# Patient Record
Sex: Female | Born: 1945 | Race: White | Hispanic: No | Smoking: Never smoker
Health system: Southern US, Community
[De-identification: ages and names within clinical notes are randomized; demographics above are authoritative.]

## PROBLEM LIST (undated history)

## (undated) DIAGNOSIS — N200 Calculus of kidney: Secondary | ICD-10-CM

## (undated) DIAGNOSIS — M109 Gout, unspecified: Secondary | ICD-10-CM

## (undated) HISTORY — DX: Calculus of kidney: N20.0

## (undated) HISTORY — DX: Gout, unspecified: M10.9

---

## 2019-01-06 DIAGNOSIS — D469 Myelodysplastic syndrome, unspecified: Secondary | ICD-10-CM | POA: Diagnosis present

## 2019-09-01 DIAGNOSIS — K219 Gastro-esophageal reflux disease without esophagitis: Secondary | ICD-10-CM | POA: Diagnosis present

## 2019-09-01 DIAGNOSIS — N1831 Chronic kidney disease, stage 3a: Secondary | ICD-10-CM | POA: Diagnosis present

## 2019-09-15 DIAGNOSIS — M1A0710 Idiopathic chronic gout, right ankle and foot, without tophus (tophi): Secondary | ICD-10-CM | POA: Diagnosis present

## 2019-10-09 IMAGING — CT CT VIRTUAL COLONOSCOPY SCREENING
3 of 9 series · 15 of 46 positions shown, 17 images · non-contrast
Comparison: Colonoscopy report of 04/16/2018. This imaged only
through the level of the sigmoid.

CLINICAL DATA: Tortuous colon. Anemia with dark bloody stools.
Prior cholecystectomy and renal stone retrieval.

EXAM:
CT VIRTUAL COLONOSCOPY SCREENING
TECHNIQUE: The patient was given a standard bowel preparation with Gastrografin
and barium for fluid and stool tagging respectively. The quality of
the bowel preparation is excellent. Automated CO2 insufflation of
the colon was performed prior to image acquisition and colonic
distention is moderate. Image post processing was used to generate a
3D endoluminal fly-through projection of the colon and to
electronically subtract stool/fluid as appropriate.

[Series 3: supine colon 1.50 br40 s3 supine thins · axial · 0.72mm/px · z∈[+1250,+1361]mm · 3 of 336 slices shown]
[im 38/336  soft-tissue]
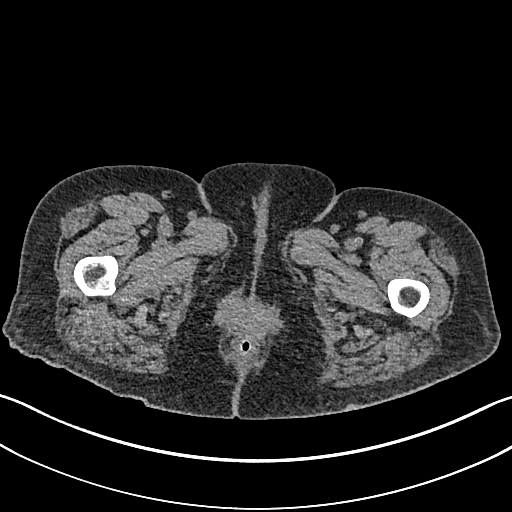
[im 75/336  soft-tissue]
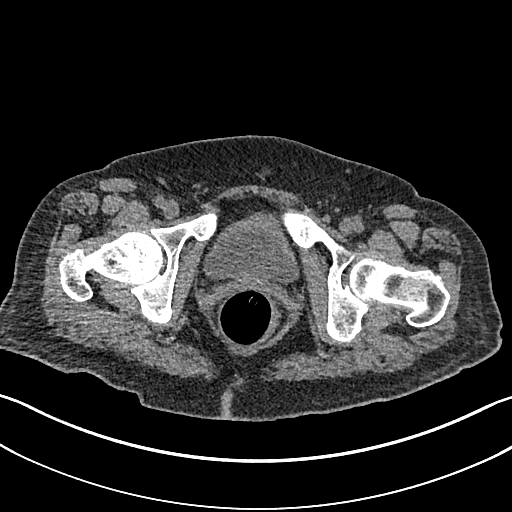
[im 112/336  soft-tissue]
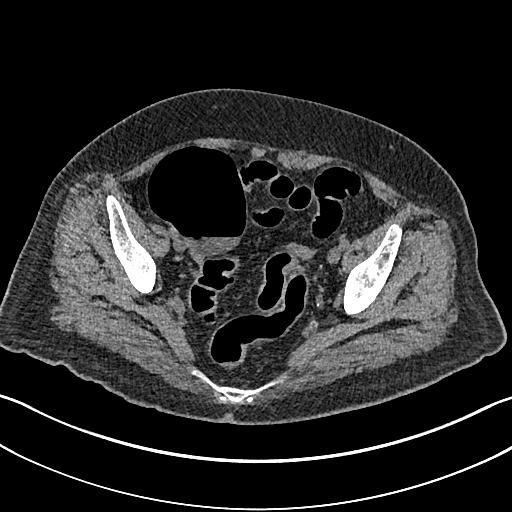

[Series 5: supine colon 3.00 br40 s3 cor cor supine · coronal · 0.72mm/px · 3 of 85 slices shown]
[im 22/85  soft-tissue]
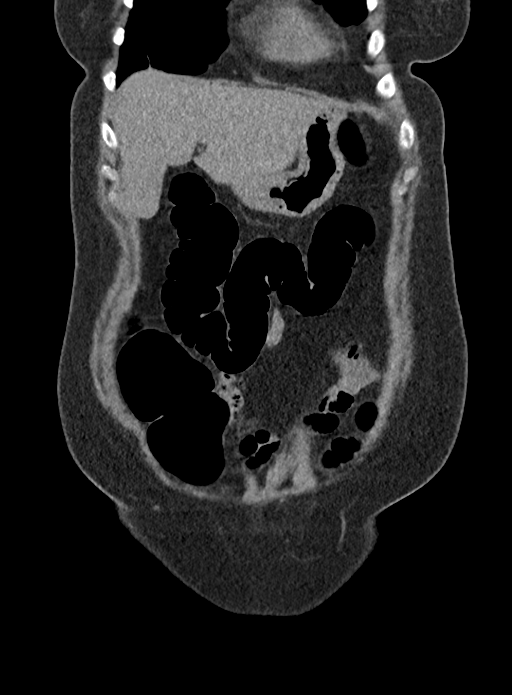
[im 43/85  soft-tissue]
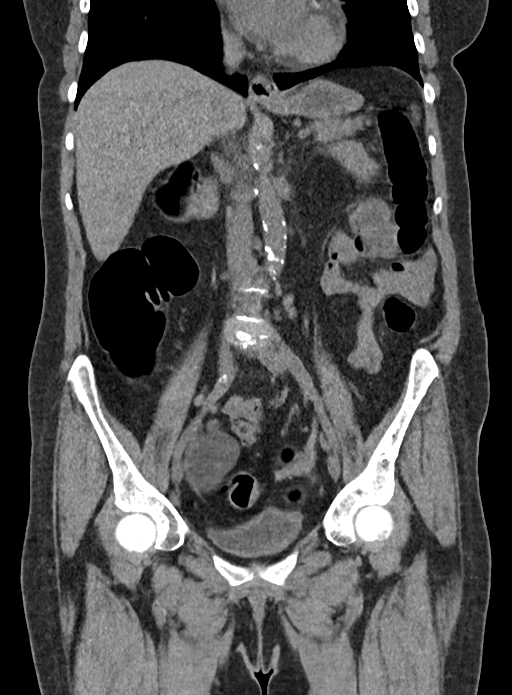
[im 64/85  soft-tissue]
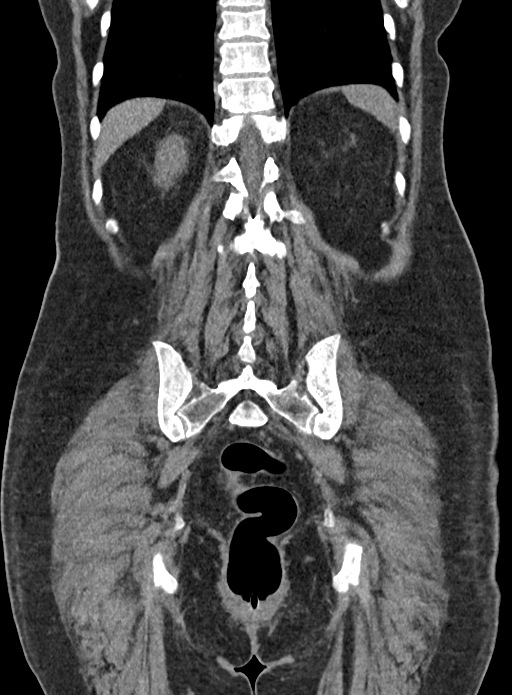

[Series 10: prone colon 1.50 br40 s3 prone thin · axial · 0.81mm/px · z∈[+1128,+1558]mm · 9 of 359 slices shown, 11 images]
[im 36/359  soft-tissue]
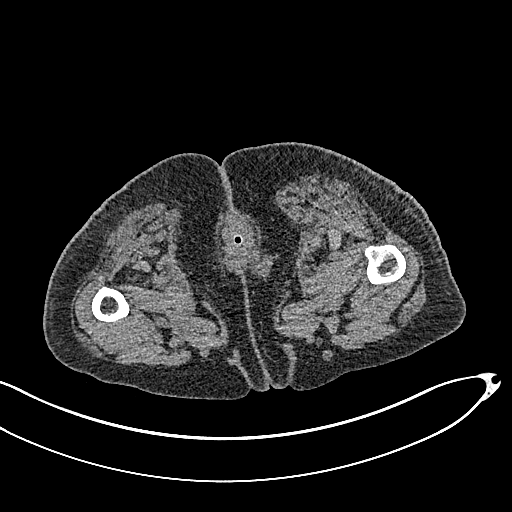
[im 36/359  bone]
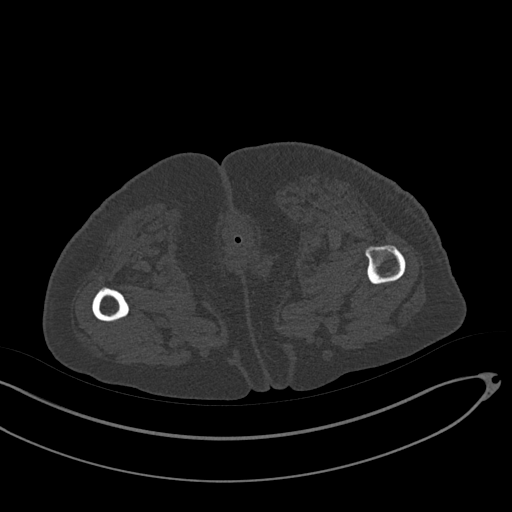
[im 72/359  soft-tissue]
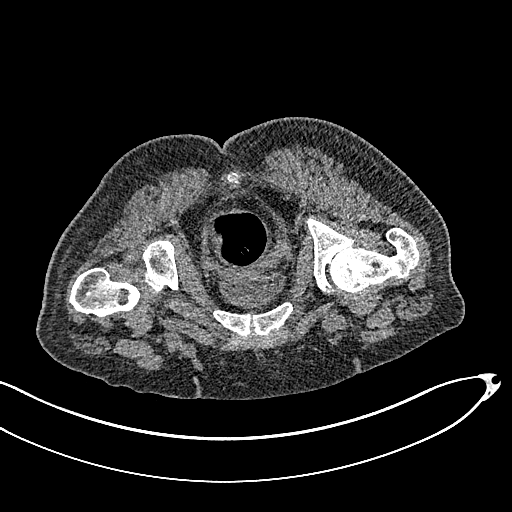
[im 108/359  soft-tissue]
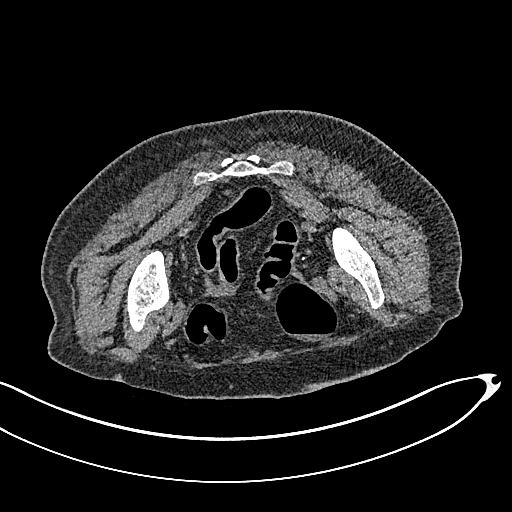
[im 144/359  soft-tissue]
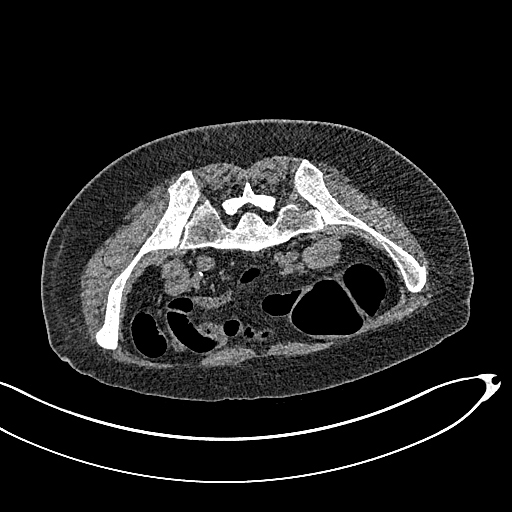
[im 180/359  soft-tissue]
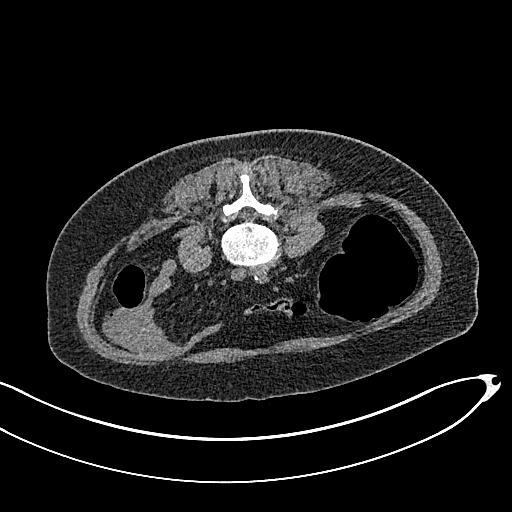
[im 215/359  soft-tissue]
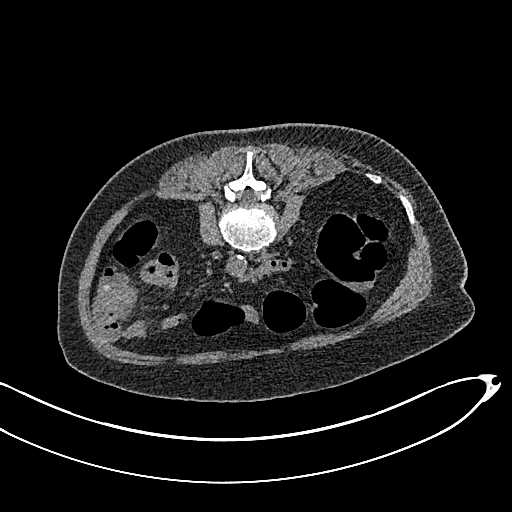
[im 251/359  soft-tissue]
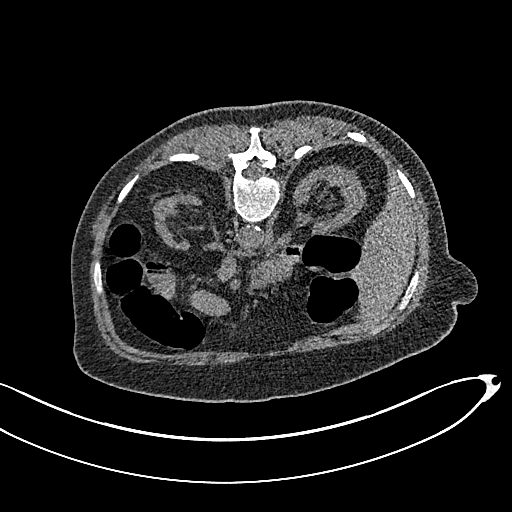
[im 287/359  soft-tissue]
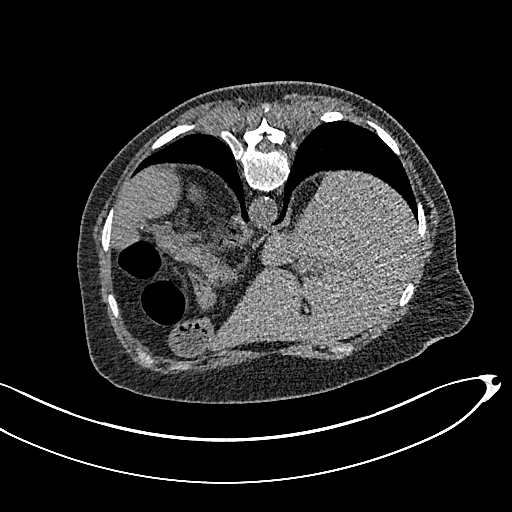
[im 323/359  soft-tissue]
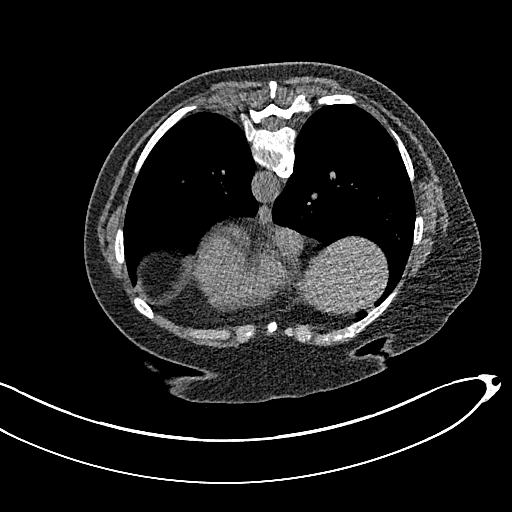
[im 323/359  bone]
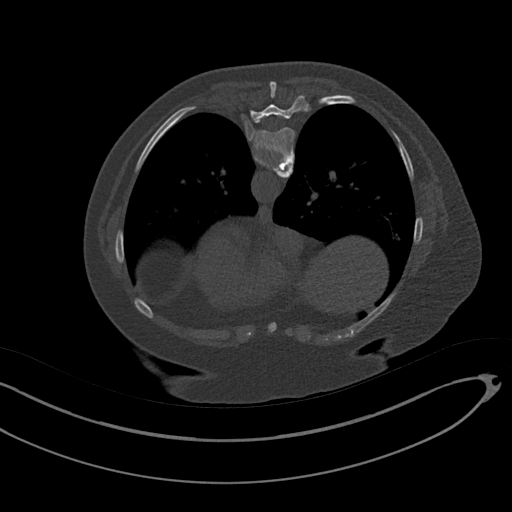

[15 of 46 positions shown; findings below may reference images not displayed]

FINDINGS: VIRTUAL COLONOSCOPY

On both supine and prone imaging, the sigmoid is suboptimally
distended. There are scattered diverticula in this area. Areas of
apparent colonic wall thickening within the sigmoid, including on
image 241/3 are most likely due to underdistention.

From the level of the distal descending colon proximally, there is
good distension without evidence of clinically significant colonic
polyp or mass.

Virtual colonoscopy is not designed to detect diminutive polyps
(i.e., less than or equal to 5 mm), the presence or absence of which
may not affect clinical management.

CT ABDOMEN AND PELVIS WITHOUT CONTRAST

Lower chest: Bibasilar subsegmental atelectasis. Normal heart size
without pericardial or pleural effusion. Multivessel coronary artery
atherosclerosis.

Hepatobiliary: Normal liver. Cholecystectomy, without biliary ductal
dilatation.

Pancreas: Normal, without mass or ductal dilatation.

Spleen: Normal in size, without focal abnormality.

Adrenals/Urinary Tract: Normal adrenal glands. Moderate bilateral
renal cortical thinning, worse on the left. 5 mm lower pole left
renal collecting system calculus. No bladder calculi.

Stomach/Bowel: Normal stomach, without wall thickening. Normal small
bowel.

Vascular/Lymphatic: Aortic atherosclerosis. No abdominopelvic
adenopathy.

Reproductive: Hysterectomy.  No adnexal mass.

Other: No significant free fluid.  No free intraperitoneal air.

Musculoskeletal: No acute osseous abnormality. Degenerate disc
disease at L4-5 and L5-S1.
IMPRESSION: 1. Suboptimal distension of the sigmoid, without evidence of
circumferential mass. Apparent areas of mild wall thickening are
likely due to underdistention. On the prior this segment was
reportedly well evaluated on colonoscopy.
2. More proximally, no evidence of clinically significant colonic
polyp or mass.
3. Left nephrolithiasis.
4.  Aortic Atherosclerosis ([id number]).

## 2019-12-05 ENCOUNTER — Inpatient Hospital Stay: Payer: No Typology Code available for payment source

## 2019-12-05 ENCOUNTER — Inpatient Hospital Stay (HOSPITAL_COMMUNITY): Payer: No Typology Code available for payment source

## 2019-12-05 ENCOUNTER — Emergency Department: Payer: No Typology Code available for payment source

## 2019-12-05 ENCOUNTER — Inpatient Hospital Stay
Admission: EM | Admit: 2019-12-05 | Discharge: 2019-12-06 | DRG: 176 | Disposition: A | Discharge status: Home / Self Care | Payer: No Typology Code available for payment source | Attending: Internal Medicine | Admitting: Internal Medicine

## 2019-12-05 DIAGNOSIS — K219 Gastro-esophageal reflux disease without esophagitis: Secondary | ICD-10-CM | POA: Diagnosis present

## 2019-12-05 DIAGNOSIS — I2699 Other pulmonary embolism without acute cor pulmonale: Secondary | ICD-10-CM

## 2019-12-05 DIAGNOSIS — M1A9XX0 Chronic gout, unspecified, without tophus (tophi): Secondary | ICD-10-CM | POA: Diagnosis present

## 2019-12-05 DIAGNOSIS — Z9049 Acquired absence of other specified parts of digestive tract: Secondary | ICD-10-CM

## 2019-12-05 DIAGNOSIS — Z8616 Personal history of COVID-19: Secondary | ICD-10-CM

## 2019-12-05 DIAGNOSIS — M1A0710 Idiopathic chronic gout, right ankle and foot, without tophus (tophi): Secondary | ICD-10-CM | POA: Diagnosis present

## 2019-12-05 DIAGNOSIS — R079 Chest pain, unspecified: Secondary | ICD-10-CM

## 2019-12-05 DIAGNOSIS — I82409 Acute embolism and thrombosis of unspecified deep veins of unspecified lower extremity: Secondary | ICD-10-CM | POA: Diagnosis present

## 2019-12-05 DIAGNOSIS — D630 Anemia in neoplastic disease: Secondary | ICD-10-CM | POA: Diagnosis present

## 2019-12-05 DIAGNOSIS — Z880 Allergy status to penicillin: Secondary | ICD-10-CM

## 2019-12-05 DIAGNOSIS — D631 Anemia in chronic kidney disease: Secondary | ICD-10-CM | POA: Diagnosis present

## 2019-12-05 DIAGNOSIS — D469 Myelodysplastic syndrome, unspecified: Secondary | ICD-10-CM | POA: Diagnosis present

## 2019-12-05 DIAGNOSIS — I82431 Acute embolism and thrombosis of right popliteal vein: Secondary | ICD-10-CM | POA: Diagnosis present

## 2019-12-05 DIAGNOSIS — Z809 Family history of malignant neoplasm, unspecified: Secondary | ICD-10-CM

## 2019-12-05 DIAGNOSIS — Z90710 Acquired absence of both cervix and uterus: Secondary | ICD-10-CM

## 2019-12-05 DIAGNOSIS — Z66 Do not resuscitate: Secondary | ICD-10-CM | POA: Diagnosis present

## 2019-12-05 DIAGNOSIS — Z79899 Other long term (current) drug therapy: Secondary | ICD-10-CM

## 2019-12-05 DIAGNOSIS — D649 Anemia, unspecified: Secondary | ICD-10-CM | POA: Diagnosis present

## 2019-12-05 DIAGNOSIS — Z87442 Personal history of urinary calculi: Secondary | ICD-10-CM

## 2019-12-05 DIAGNOSIS — N1831 Chronic kidney disease, stage 3a: Secondary | ICD-10-CM | POA: Diagnosis present

## 2019-12-05 LAB — ECG 12-LEAD
P Wave Axis: 43 deg
P-R Interval: 132 ms
Patient Age: 73 years
Q-T Interval(Corrected): 448 ms
Q-T Interval: 362 ms
QRS Axis: 22 deg
QRS Duration: 87 ms
T Axis: 28 years
Ventricular Rate: 92 //min

## 2019-12-05 LAB — CBC AND DIFFERENTIAL
Basophils %: 1.8 % (ref 0.0–3.0)
Basophils Absolute: 0.1 10*3/uL (ref 0.0–0.3)
Eosinophils %: 5.4 % (ref 0.0–7.0)
Eosinophils Absolute: 0.3 10*3/uL (ref 0.0–0.8)
Hematocrit: 33.1 % — ABNORMAL LOW (ref 36.0–48.0)
Hemoglobin: 9.9 gm/dL — ABNORMAL LOW (ref 12.0–16.0)
Lymphocytes Absolute: 1.5 10*3/uL (ref 0.6–5.1)
Lymphocytes: 23 % (ref 15.0–46.0)
MCH: 31 pg (ref 28–35)
MCHC: 30 gm/dL — ABNORMAL LOW (ref 32–36)
MCV: 103 fL — ABNORMAL HIGH (ref 80–100)
MPV: 10.5 fL — ABNORMAL HIGH (ref 6.0–10.0)
Monocytes Absolute: 0.7 10*3/uL (ref 0.1–1.7)
Monocytes: 11.4 % (ref 3.0–15.0)
Neutrophils %: 58.4 % (ref 42.0–78.0)
Neutrophils Absolute: 3.8 10*3/uL (ref 1.7–8.6)
PLT CT: 188 10*3/uL (ref 130–440)
RBC: 3.23 10*6/uL — ABNORMAL LOW (ref 3.80–5.00)
RDW: 20 % — ABNORMAL HIGH (ref 11.0–14.0)
WBC: 6.4 10*3/uL (ref 4.0–11.0)

## 2019-12-05 LAB — VH URINALYSIS WITH MICROSCOPIC IF INDICATED
Bilirubin, UA: NEGATIVE
Blood, UA: NEGATIVE
Glucose, UA: NEGATIVE mg/dL
Ketones UA: NEGATIVE mg/dL
Leukocyte Esterase, UA: 75 Leu/uL — AB
Nitrite, UA: NEGATIVE
Protein, UR: NEGATIVE mg/dL
RBC, UA: 3 /hpf (ref 0–5)
Squam Epithel, UA: 52 /lpf — ABNORMAL HIGH (ref 0–2)
Urine Specific Gravity: 1.017
Urobilinogen, UA: NORMAL mg/dL
WBC, UA: 6 /hpf — ABNORMAL HIGH (ref 0–4)
pH, Urine: 5.5 pH (ref 5.0–8.0)

## 2019-12-05 LAB — COMPREHENSIVE METABOLIC PANEL
ALT: <6 U/L (ref 0–55)
AST (SGOT): 10 U/L (ref 10–42)
Albumin/Globulin Ratio: 0.97 Ratio (ref 0.80–2.00)
Albumin: 3 gm/dL — ABNORMAL LOW (ref 3.5–5.0)
Alkaline Phosphatase: 74 U/L (ref 40–145)
Anion Gap: 12.4 mMol/L (ref 7.0–18.0)
BUN / Creatinine Ratio: 12.5 Ratio (ref 10.0–30.0)
BUN: 14 mg/dL (ref 7–22)
Bilirubin, Total: 0.3 mg/dL (ref 0.1–1.2)
CO2: 27 mMol/L (ref 20.0–30.0)
Calcium: 8.7 mg/dL (ref 8.5–10.5)
Chloride: 104 mMol/L (ref 98–110)
Creatinine: 1.12 mg/dL (ref 0.60–1.20)
EGFR: 48 mL/min/{1.73_m2} — ABNORMAL LOW (ref 60–150)
Globulin: 3.1 gm/dL (ref 2.0–4.0)
Glucose: 113 mg/dL — ABNORMAL HIGH (ref 71–99)
Osmolality Calculated: 281 mOsm/kg (ref 275–300)
Potassium: 3.4 mMol/L — ABNORMAL LOW (ref 3.5–5.3)
Protein, Total: 6.1 gm/dL (ref 6.0–8.3)
Sodium: 140 mMol/L (ref 136–147)

## 2019-12-05 LAB — HEMOGLOBIN AND HEMATOCRIT, BLOOD
Hematocrit: 31.3 % — ABNORMAL LOW (ref 36.0–48.0)
Hemoglobin: 9.5 gm/dL — ABNORMAL LOW (ref 12.0–16.0)

## 2019-12-05 LAB — D-DIMER, QUANTITATIVE: D-Dimer: 2.54 mg/L FEU — ABNORMAL HIGH (ref 0.19–0.52)

## 2019-12-05 LAB — TROPONIN I: Troponin I: 0 ng/mL (ref 0.00–0.02)

## 2019-12-05 LAB — PT/INR
PT INR: 1 (ref 0.9–1.2)
PT: 10.5 s (ref 9.4–11.5)

## 2019-12-05 LAB — APTT: aPTT: 24 s (ref 22.0–33.0)

## 2019-12-05 LAB — B-TYPE NATRIURETIC PEPTIDE: B-Natriuretic Peptide: 36.7 pg/mL (ref 0.0–100.0)

## 2019-12-05 MED ORDER — VH HEPARIN SODIUM (PORCINE) 10000 UNIT/ML IJ SOLN (INITIAL BOLUS)
5000.00 [IU] | Freq: Once | INTRAMUSCULAR | Status: DC
Start: 2019-12-05 — End: 2019-12-05

## 2019-12-05 MED ORDER — VH HEPARIN SODIUM (PORCINE) 10000 UNIT/ML IJ SOLN (REBOLUS)
2000.00 [IU] | Freq: Four times a day (QID) | INTRAMUSCULAR | Status: DC | PRN
Start: 2019-12-05 — End: 2019-12-05

## 2019-12-05 MED ORDER — BISACODYL 5 MG PO TBEC
5.00 mg | DELAYED_RELEASE_TABLET | Freq: Every day | ORAL | Status: DC
Start: 2019-12-07 — End: 2019-12-06

## 2019-12-05 MED ORDER — NALOXONE HCL 0.4 MG/ML IJ SOLN (WRAP)
0.40 mg | INTRAMUSCULAR | Status: DC | PRN
Start: 2019-12-05 — End: 2019-12-06

## 2019-12-05 MED ORDER — ACETAMINOPHEN 160 MG/5ML PO SOLN
650.00 mg | ORAL | Status: DC | PRN
Start: 2019-12-05 — End: 2019-12-06

## 2019-12-05 MED ORDER — BISACODYL 10 MG RE SUPP
10.00 mg | Freq: Every day | RECTAL | Status: DC
Start: 2019-12-07 — End: 2019-12-06

## 2019-12-05 MED ORDER — PANTOPRAZOLE SODIUM 40 MG PO TBEC
40.00 mg | DELAYED_RELEASE_TABLET | Freq: Two times a day (BID) | ORAL | Status: DC
Start: 2019-12-05 — End: 2019-12-06
  Administered 2019-12-05: 16:00:00 40 mg via ORAL
  Filled 2019-12-05 (×2): qty 1

## 2019-12-05 MED ORDER — VH POTASSIUM CHLORIDE CRYS ER 20 MEQ PO TBCR (WRAP)
40.00 meq | EXTENDED_RELEASE_TABLET | Freq: Once | ORAL | Status: DC
Start: 2019-12-05 — End: 2019-12-05

## 2019-12-05 MED ORDER — ALLOPURINOL 100 MG PO TABS
100.0000 mg | ORAL_TABLET | Freq: Every day | ORAL | Status: DC
Start: 2019-12-05 — End: 2019-12-06
  Administered 2019-12-05: 16:00:00 100 mg via ORAL
  Filled 2019-12-05 (×2): qty 1

## 2019-12-05 MED ORDER — OXYCODONE-ACETAMINOPHEN 5-325 MG PO TABS
1.0000 | ORAL_TABLET | ORAL | Status: DC | PRN
Start: 2019-12-05 — End: 2019-12-06
  Administered 2019-12-05 – 2019-12-06 (×2): 2 via ORAL
  Filled 2019-12-05 (×2): qty 2

## 2019-12-05 MED ORDER — ACETAMINOPHEN 325 MG PO TABS
650.0000 mg | ORAL_TABLET | ORAL | Status: DC | PRN
Start: 2019-12-05 — End: 2019-12-06
  Administered 2019-12-05 – 2019-12-06 (×2): 650 mg via ORAL
  Filled 2019-12-05 (×2): qty 2

## 2019-12-05 MED ORDER — VH HEPARIN (PORCINE) IN D5W 50-5 UNIT/ML-% IV SOLN
0.00 [IU]/h | INTRAVENOUS | Status: DC
Start: 2019-12-05 — End: 2019-12-05

## 2019-12-05 MED ORDER — ENOXAPARIN SODIUM 60 MG/0.6ML SC SOLN
1.00 mg/kg | Freq: Two times a day (BID) | SUBCUTANEOUS | Status: DC
Start: 2019-12-05 — End: 2019-12-06
  Administered 2019-12-05 – 2019-12-06 (×3): 60 mg via SUBCUTANEOUS
  Filled 2019-12-05 (×2): qty 0.6

## 2019-12-05 MED ORDER — PROCHLORPERAZINE EDISYLATE 10 MG/2ML IJ SOLN
5.00 mg | INTRAMUSCULAR | Status: DC | PRN
Start: 2019-12-05 — End: 2019-12-06
  Administered 2019-12-06: 03:00:00 5 mg via INTRAVENOUS
  Filled 2019-12-05: qty 2

## 2019-12-05 MED ORDER — POLYETHYLENE GLYCOL 3350 17 G PO PACK
17.00 g | PACK | Freq: Two times a day (BID) | ORAL | Status: DC
Start: 2019-12-05 — End: 2019-12-06
  Filled 2019-12-05 (×2): qty 1

## 2019-12-05 MED ORDER — SODIUM CHLORIDE (PF) 0.9 % IJ SOLN
3.00 mL | Freq: Three times a day (TID) | INTRAMUSCULAR | Status: DC
Start: 2019-12-05 — End: 2019-12-06
  Administered 2019-12-05 – 2019-12-06 (×3): 3 mL via INTRAVENOUS
  Filled 2019-12-05: qty 10

## 2019-12-05 MED ORDER — ONDANSETRON 4 MG PO TBDP
4.00 mg | ORAL_TABLET | Freq: Three times a day (TID) | ORAL | Status: DC | PRN
Start: 2019-12-05 — End: 2019-12-06

## 2019-12-05 MED ORDER — ONDANSETRON HCL 4 MG/2ML IJ SOLN
4.00 mg | Freq: Three times a day (TID) | INTRAMUSCULAR | Status: DC | PRN
Start: 2019-12-05 — End: 2019-12-06
  Administered 2019-12-06: 10:00:00 4 mg via INTRAVENOUS
  Filled 2019-12-05: qty 2

## 2019-12-05 MED ORDER — ACETAMINOPHEN 10 MG/ML IV SOLN
INTRAVENOUS | Status: AC
Start: 2019-12-05 — End: ?
  Filled 2019-12-05: qty 100

## 2019-12-05 MED ORDER — ACETAMINOPHEN 10 MG/ML IV SOLN
1000.00 mg | Freq: Once | INTRAVENOUS | Status: AC
Start: 2019-12-05 — End: 2019-12-05
  Administered 2019-12-05: 09:00:00 1000 mg via INTRAVENOUS

## 2019-12-05 MED ORDER — FERROUS SULFATE 325 (65 FE) MG PO TABS
325.0000 mg | ORAL_TABLET | Freq: Every morning | ORAL | Status: DC
Start: 2019-12-05 — End: 2019-12-06
  Administered 2019-12-05: 16:00:00 325 mg via ORAL
  Filled 2019-12-05 (×2): qty 1

## 2019-12-05 MED ORDER — MORPHINE SULFATE 4 MG/ML IJ/IV SOLN (WRAP)
1.0000 mg | Status: DC | PRN
Start: 2019-12-05 — End: 2019-12-06
  Filled 2019-12-05: qty 1

## 2019-12-05 MED ORDER — POTASSIUM CHLORIDE 20 MEQ/15ML (10%) PO SOLN
ORAL | Status: AC
Start: 2019-12-05 — End: ?
  Filled 2019-12-05: qty 30

## 2019-12-05 MED ORDER — MELATONIN 3 MG PO TABS
3.0000 mg | ORAL_TABLET | Freq: Every evening | ORAL | Status: DC | PRN
Start: 2019-12-05 — End: 2019-12-06

## 2019-12-05 MED ORDER — VH POTASSIUM CHLORIDE CRYS ER 20 MEQ PO TBCR (WRAP)
EXTENDED_RELEASE_TABLET | ORAL | Status: AC
Start: 2019-12-05 — End: ?
  Filled 2019-12-05: qty 2

## 2019-12-05 MED ORDER — IOHEXOL 350 MG/ML IV SOLN
70.00 mL | Freq: Once | INTRAVENOUS | Status: AC | PRN
Start: 2019-12-05 — End: 2019-12-05
  Administered 2019-12-05: 11:00:00 70 mL via INTRAVENOUS

## 2019-12-05 MED ORDER — ACETAMINOPHEN 650 MG RE SUPP
650.00 mg | RECTAL | Status: DC | PRN
Start: 2019-12-05 — End: 2019-12-06

## 2019-12-05 MED ORDER — POTASSIUM CHLORIDE 20 MEQ/15ML (10%) PO SOLN
40.00 meq | Freq: Once | ORAL | Status: AC
Start: 2019-12-05 — End: 2019-12-05
  Administered 2019-12-05: 13:00:00 40 meq via ORAL

## 2019-12-05 MED ORDER — ENOXAPARIN SODIUM 60 MG/0.6ML SC SOLN
1.00 mg/kg | Freq: Two times a day (BID) | SUBCUTANEOUS | Status: DC
Start: 2019-12-05 — End: 2019-12-05

## 2019-12-05 MED ORDER — ENOXAPARIN SODIUM 60 MG/0.6ML SC SOLN
SUBCUTANEOUS | Status: AC
Start: 2019-12-05 — End: ?
  Filled 2019-12-05: qty 0.6

## 2019-12-05 NOTE — Progress Notes (Addendum)
NURSE NOTE SUMMARY  [facility] MED SURG   Patient Name: Jillian Durham   Attending Physician: Shelva Majestic, MD   Today's date:   12/05/2019 LOS: 0 days   Shift Summary:                                                              64 - Assumed care of pt, received report from [place], RN    [phone] - 3 attempts made to draw blood work between Lohman, PSA, and I, all attempts unsuccessful. I called Meagan, nursing supervisor, to attempt.     2143 - Pt c/o 10/10 chest and shoulder pain, describes it as the same pain that brought her here. I attempted to give IV morphine for quick relief but pt's daughter did not want her to receive it. Instead, 2 tabs percocet were given and MD updated.     9604 - Pt stated she felt "light headed" and sick to her stomach. Compazine given, checked pt's vitals as well. O2 was 86% on room air, I placed her on 1L and her O2 went up to 95% with deep breathing exercises. MD made aware.    5409 - Pt asked Jillian, PSA, if she could take the oxygen off. Jillian advised pt to keep on and reminded her why it was needed for now    0520 - Pt sleeping     Provider Notifications:        Rapid Response Notifications:  Mobility:          Weight tracking:  Family Dynamic:   Last 3 Weights for the past 72 hrs (Last 3 readings):   Weight   12/05/19 0826 59 kg (130 lb)             Last Bowel Movement   No data recorded

## 2019-12-05 NOTE — H&P (Signed)
Hospitalist Admission History and Physical    Jillian Durham  MRN: [id number]  DOB: 09-Apr-1946  74 y.o., female  ?  ASSESSMENT/PLAN    Principal Problem:    Acute pulmonary embolism, bilateral  Active Problems:    Chronic gout of right foot    Stage 3a chronic kidney disease, baseline Cr 1.1-1.2    MDS (myelodysplastic syndrome)    GERD (gastroesophageal reflux disease)    Chronic anemia, baseline Hg 8-9    Pulmonary embolus and infarction, RLL    Pulmonary embolism with infarction      Acute pulmonary embolism, bilateral  Pulmonary embolus and infarction, RLL  -likely precipitated by revlimid and 6 hour car ride on 3/5  -no RH strain on CT  -check US DVT  -check Echocardiogram  -lovenox 1mg /kg BID  -consider DOAC tomorrow  -pain control, bowel regimen  -not hypoxic currently  -telemetry  -monitor     Stage 3a chronic kidney disease, baseline Cr 1.1-1.2  -admit Cr 1.12  -avoid nephrotoxin, monitor     MDS (myelodysplastic syndrome)  Chronic anemia, baseline Hg 8-9  -follow with oncologist in [place], recently on revlimid  -admit Hg 9.9  -no bleeding currently, though history of melena with negative endoscopic workup  -recheck H/H tonight after initiation of anticoagulation, increase PPI to BID   -counts otherwise acceptable     GERD (gastroesophageal reflux disease)  -PPI    Chronic gout of right foot  -allopurinol     History of COVID 19 infection  -January, recovered     GI Prophylaxis  continue PPI    Nutrition  Regular    DVT/VTE Prophylaxis  Lovenox 1 mg/kg subQ BID    Code Status: DNR, pressors okay, confirmed on admission with patient and daughter     This patient is being admitted under INPATIENT with Acute pulmonary embolism.    Face to Face Evaluation was completed at 12:12 PM on 12/05/2019     Jillian Durham  12:12 PM  12/05/2019  -------------------------------------------------------------------------------------------------------------------    Patient name: Jillian Durham  Date of Birth:  1946/07/12  MRN: [id number]    Date of admission: 12/05/2019  Current Length of Stay: 0  ?  Attending MD: Jillian Durham*  Primary care physician: Pcp, None, MD    Chief Complaint: right chest pain    HPI: Patient is a 74 y.o. female who presents with right side chest pain since Satruday.  She has MDS and takes revlimid, though currently in middle of week off.  She went on 6 hour car ride from [place] to here on Friday.  She said her legs were swollen that day.  The next day, Saturday, she had onset of right sided chest pain up to her right shoulder. This pain worse with movement, cough or deep breath.  Otherwise she gets SOB but only due to pain peaking from these provocations. She has chronic cough since she had covid in early [place] which she survived.  No fever, chills, headache, other SOB, other chest pain, nausea, vomiting, new diarrhea (has chronically), dysuria, new focal neuro changes.      The patient arrived by private car and is accompanied by daughter.      In ED vital signs essentially unremarkable. Labs stable anemia and CKD 3.  Dimer up, CTA chest bilateral PE with RLL infarction. Admitted for management.        ROS   Review of Systems   Constitutional: Positive for malaise/fatigue. Negative for chills  and fever.   HENT: Negative.    Eyes: Negative.    Respiratory: Positive for shortness of breath (with pain).    Cardiovascular: Positive for chest pain (right side).   Gastrointestinal: Positive for diarrhea (chronic). Negative for abdominal pain and heartburn.   Genitourinary: Negative.    Musculoskeletal: Positive for joint pain.   Skin: Negative.    Neurological: Positive for weakness. Negative for focal weakness.   Endo/Heme/Allergies: Negative.    Psychiatric/Behavioral: Negative.        Past Medical History  Past Medical History:   Diagnosis Date    Calculus of kidney     Gout         Past Surgical History  Past Surgical History:   Procedure Laterality Date    CHOLECYSTECTOMY       HYSTERECTOMY          Social History  Ashling  reports that she has never smoked. She has never used smokeless tobacco. She reports that she does not drink alcohol or use drugs.  Social History     Social History Narrative    Not on file       Family History  Olena's family history includes Cancer in her father and mother.  Anayla   Family Status   Relation Name Status    Mother  (Not Specified)    Father  (Not Specified)       Home Medications  Prior to Admission medications    Medication Sig Start Date End Date Taking? Authorizing Provider   allopurinol (ZYLOPRIM) 100 MG tablet Take 100 mg by mouth daily   Yes [provider]   ferrous sulfate 300 (60 Fe) MG/5ML syrup Take 65 mg by mouth daily   Yes [provider]   furosemide (LASIX) 20 MG tablet Take 20 mg by mouth 2 (two) times daily   Yes [provider]   loratadine (CLARITIN) 10 MG tablet Take 10 mg by mouth daily   Yes [provider]   melatonin 3 mg tablet Take 3 mg by mouth   Yes [provider]   omeprazole (PriLOSEC) 20 MG capsule Take 20 mg by mouth daily   Yes [provider]   Pediatric Multivitamins-Fl (Multivitamin/Fluoride) 0.25 MG Chew Tab Chew by mouth   Yes [provider]   vitamin D, cholecalciferol, 10 MCG (400 UNIT) tablet Take 25 mcg by mouth daily   Yes [provider]       Current Medications:  Scheduled Meds:  Current Facility-Administered Medications   Medication Dose Route Frequency    enoxaparin  1 mg/kg Subcutaneous Q12H     Continuous Infusions:  PRN Meds:   ?  ?  Allergies  Allergies   Allergen Reactions    Penicillins Itching      ?  Physical Examination  BP 107/58    Pulse 82    Temp 97.9 F (36.6 C) (Oral)    Resp 19    Ht 1.6 m (5\' 3" )    Wt 59 kg (130 lb)    SpO2 95%    BMI 23.03 kg/m   Vitals:    12/05/19 1117   BP: 107/58   Pulse: 82   Resp: 19   Temp:    SpO2: 95%       Physical Exam  Constitutional:       General: She is in acute  distress.      Appearance: Normal appearance. She is normal weight. She is  ill-appearing (chronic).   HENT:      Head: Normocephalic and atraumatic.      Nose: Nose normal.      Mouth/Throat:      Mouth: Mucous membranes are moist.   Eyes:      Extraocular Movements: Extraocular movements intact.      Pupils: Pupils are equal, round, and reactive to light.   Neck:      Musculoskeletal: Neck supple.   Cardiovascular:      Rate and Rhythm: Normal rate and regular rhythm.      Heart sounds: Normal heart sounds.   Pulmonary:      Effort: Pulmonary effort is normal. No respiratory distress.      Breath sounds: Normal breath sounds. No wheezing, rhonchi or rales.   Chest:      Chest wall: Tenderness (right side) present.   Abdominal:      General: Bowel sounds are normal. There is no distension.      Palpations: Abdomen is soft.      Tenderness: There is no abdominal tenderness. There is no guarding or rebound.   Musculoskeletal:      Right lower leg: No edema.      Left lower leg: No edema.   Skin:     General: Skin is warm and dry.   Neurological:      General: No focal deficit present.      Mental Status: She is oriented to person, place, and time.   Psychiatric:         Mood and Affect: Mood normal.         Behavior: Behavior normal.           DATA  Labs:  I have personally reviewed the following studies:    Results for orders placed or performed during the hospital encounter of 12/05/19   CBC and differential   Result Value Ref Range    WBC 6.4 4.0 - 11.0 K/cmm    RBC 3.23 (L) 3.80 - 5.00 M/cmm    Hemoglobin 9.9 (L) 12.0 - 16.0 gm/dL    Hematocrit 09.8 (L) 36.0 - 48.0 %    MCV 103 (H) 80 - 100 fL    MCH 31 28 - 35 pg    MCHC 30 (L) 32 - 36 gm/dL    RDW 11.9 (H) 14.7 - 14.0 %    PLT CT 188 130 - 440 K/cmm    MPV 10.5 (H) 6.0 - 10.0 fL    Neutrophils % 58.4 42.0 - 78.0 %    Lymphocytes 23.0 15.0 - 46.0 %    Monocytes 11.4 3.0 - 15.0 %    Eosinophils % 5.4 0.0 - 7.0 %    Basophils % 1.8 0.0 - 3.0 %    Neutrophils  Absolute 3.8 1.7 - 8.6 K/cmm    Lymphocytes Absolute 1.5 0.6 - 5.1 K/cmm    Monocytes Absolute 0.7 0.1 - 1.7 K/cmm    Eosinophils Absolute 0.3 0.0 - 0.8 K/cmm    Basophils Absolute 0.1 0.0 - 0.3 K/cmm    RBC Morphology Morphology Consistent with Hemogram     Anisocytosis 1+    Comprehensive metabolic panel   Result Value Ref Range    Sodium 140 136 - 147 mMol/L    Potassium 3.4 (L) 3.5 - 5.3 mMol/L    Chloride 104 98 - 110 mMol/L    CO2 27.0 20.0 - 30.0 mMol/L    Calcium 8.7 8.5 - 10.5 mg/dL  Glucose 113 (H) 71 - 99 mg/dL    Creatinine 1.61 0.96 - 1.20 mg/dL    BUN 14 7 - 22 mg/dL    Protein, Total 6.1 6.0 - 8.3 gm/dL    Albumin 3.0 (L) 3.5 - 5.0 gm/dL    Alkaline Phosphatase 74 40 - 145 U/L    ALT <6 0 - 55 U/L    AST (SGOT) 10 10 - 42 U/L    Bilirubin, Total 0.3 0.1 - 1.2 mg/dL    Albumin/Globulin Ratio 0.97 0.80 - 2.00 Ratio    Anion Gap 12.4 7.0 - 18.0 mMol/L    BUN / Creatinine Ratio 12.5 10.0 - 30.0 Ratio    EGFR 48 (L) 60 - 150 mL/min/1.6m2    Osmolality Calculated 281 275 - 300 mOsm/kg    Globulin 3.1 2.0 - 4.0 gm/dL   Troponin I   Result Value Ref Range    Troponin I 0.00 0.00 - 0.02 ng/mL   D-dimer, quantitative   Result Value Ref Range    D-Dimer 2.54 (H) 0.19 - 0.52 mg/L FEU   B-type Natriuretic Peptide   Result Value Ref Range    B-Natriuretic Peptide 36.7 0.0 - 100.0 pg/mL   Urinalysis with Microscopic if Indicated   Result Value Ref Range    Color, UA Light-Yellow Colorless,Yellow,Light-Yellow    Clarity, UA Clear Clear    Urine Specific Gravity 1.017     pH, Urine 5.5 5.0 - 8.0 pH    Protein, UR Negative Negative mg/dL    Glucose, UA Negative Negative mg/dL    Ketones UA Negative Negative mg/dL    Bilirubin, UA Negative Negative    Blood, UA Negative Negative    Nitrite, UA Negative Negative    Urobilinogen, UA Normal Normal mg/dL    Leukocyte Esterase, UA 75 (A) Negative Leu/uL    UR Micro Performed     WBC, UA 6 (H) 0 - 4 /hpf    RBC, UA 3 0 - 5 /hpf    Squam Epithel, UA 52 (H) 0 - 2 /lpf    Prothrombin time/INR   Result Value Ref Range    PT 10.5 9.4 - 11.5 sec    PT INR 1.0 0.9 - 1.2   APTT   Result Value Ref Range    aPTT 24.0 22.0 - 33.0 sec   ECG 12 lead   Result Value Ref Range    Patient Age 17 years    Patient DOB 1946-09-05     Patient Height      Patient Weight      Interpretation Text       Sinus rhythm  Baseline wander in lead(s) I,III,aVL,aVF  No previous ECG available for comparison      Physician Interpreter      Ventricular Rate 92 //min    QRS Duration 87 ms    P-R Interval 132 ms    Q-T Interval 362 ms    Q-T Interval(Corrected) 448 ms    P Wave Axis 43 deg    QRS Axis 22 deg    T Axis 28 years         Imaging:   CT Angiogram Chest   Final Result   1. There are bilateral pulmonary emboli as described above without CT evidence of pulmonary arterial hypertension.   2. There is patchy groundglass opacification in the right lower lobe and a small right pleural effusion. This is nonspecific but could represent pulmonary ischemia/infarct in the setting of  pulmonary emboli. Atypical infection or asymmetric edema could    have a similar appearance.      RECOMMENDATION: If clinical submassive or massive PE, consider consulting interventional radiology to evaluate if the patient is a candidate for catheter directed thrombolysis.      CRITICAL RESULT:    Critical result of pulmonary embolism was communicated with Dr. Mittie Bodo by myself on December 05, 2019 at 11:10 AM.  Understanding of critical result was confirmed by a read back.       ReadingStation:[email or web address]      XR Chest AP Portable   Final Result   There is right basilar airspace disease suspected to represent developing infection. Follow-up to resolution is recommended.      ReadingStation:[email or web address]            EKG:    EKG Results     Procedure Component Value Units Date/Time    ECG 12 lead [[id number]] Collected: 12/05/19 0831     Updated: 12/05/19 0833     Patient Age 26 years      Patient DOB [birth year]     Patient Height --     Patient  Weight --     Interpretation Text --     Sinus rhythm  Baseline wander in lead(s) I,III,aVL,aVF  No previous ECG available for comparison       Physician Interpreter --     Ventricular Rate 92 //min      QRS Duration 87 ms      P-R Interval 132 ms      Q-T Interval 362 ms      Q-T Interval(Corrected) 448 ms      P Wave Axis 43 deg      QRS Axis 22 deg      T Axis 28 years           ?  Called the patient's family: discussed extensively in presence of one daughter in person and one daughter by phone   ?  ?  Jillian Geralds [facility]  12/05/2019  ?  ?

## 2019-12-05 NOTE — ED Notes (Signed)
Xray tech at pt bedside. 

## 2019-12-05 NOTE — ED Provider Notes (Signed)
[facility]  Emergency Department       Patient Name: Jillian Durham, Jillian Durham Patient DOB:  Sep 21, 1946   Encounter Date:  12/05/2019 Age: 74 y.o. female   Attending ED Physician: Blima Dessert, MD MRN:  [id number]   Room:  10/EDA10-A PCP: Pcp, None, MD      Diagnosis / Disposition:   Final Impression  1. Bilateral pulmonary embolism    2. Chest pain, unspecified type        Disposition              ED Disposition     ED Disposition Condition Date/Time Comment    [facility] Admit to Medical  Mon Dec 05, 2019 11:18 AM Dr. Lazaro Arms. Stable.           Follow up  No follow-up provider specified.    Prescriptions  New Prescriptions    No medications on file         History of Presenting Illness:   Chief complaint: Chest Pain      VWU:JWJX is a 74 year old female patient known to have history of MDS, murmur, neuropathy, kidney disease, who is presenting here today with her daughter for right chest, right shoulder, pain that started around 2 weeks ago.  Patient states that pain started around 1 week ago, started with the right shoulder pain that worsened down to her right chest, 10/10 in severity, sharp and aching nature, worsened with movement and deep breaths, relieved with Tylenol and ibuprofen.  Patient denies any fever chills, cough, wheezing, no abdominal pain, no flank pain, no urine symptoms, no other symptoms.  Daughter at bedside.  Patient does not look distressed.           No notes on file    In addition to the above history, please see nursing notes. Allergies, meds, past medical, family, social hx, and the results of the diagnostic studies performed have been reviewed by myself.      Review of Systems   Review of Systems   HENT: Negative.    Respiratory: Negative.    Cardiovascular: Positive for chest pain.        Right chest pain, right shoulder pain   Gastrointestinal: Negative.    Genitourinary: Negative.    Musculoskeletal: Negative.    Neurological: Negative.    All other systems reviewed and are  negative.      All other systems reviewed and negative except as above, pertinent findings in HPI.        Allergies / Medications:   Pt is allergic to penicillins.    Current/Home Medications    ALLOPURINOL (ZYLOPRIM) 100 MG TABLET    Take 100 mg by mouth daily    FERROUS SULFATE 300 (60 FE) MG/5ML SYRUP    Take 65 mg by mouth daily    FUROSEMIDE (LASIX) 20 MG TABLET    Take 20 mg by mouth 2 (two) times daily    LORATADINE (CLARITIN) 10 MG TABLET    Take 10 mg by mouth daily    MELATONIN 3 MG TABLET    Take 3 mg by mouth    OMEPRAZOLE (PRILOSEC) 20 MG CAPSULE    Take 20 mg by mouth daily    PEDIATRIC MULTIVITAMINS-FL (MULTIVITAMIN/FLUORIDE) 0.25 MG CHEW TAB    Chew by mouth    VITAMIN D, CHOLECALCIFEROL, 10 MCG (400 UNIT) TABLET    Take 25 mcg by mouth daily         Past History:   Medical: Pt  has a past medical history of Calculus of kidney and Gout.    Surgical: Pt  has a past surgical history that includes Cholecystectomy and Hysterectomy.    Family: The family history includes Cancer in her father and mother.    Social: Pt reports that she has never smoked. She has never used smokeless tobacco. She reports that she does not drink alcohol or use drugs.      Physical Exam:   Physical Exam  Vitals signs and nursing note reviewed.   Constitutional:       General: She is not in acute distress.     Appearance: She is well-developed. She is not ill-appearing, toxic-appearing or diaphoretic.   HENT:      Head: Normocephalic and atraumatic.   Eyes:      Extraocular Movements: Extraocular movements intact.   Cardiovascular:      Rate and Rhythm: Normal rate and regular rhythm.      Heart sounds: Normal heart sounds. Heart sounds not distant. No murmur.   Pulmonary:      Effort: Pulmonary effort is normal. No tachypnea, accessory muscle usage or respiratory distress.      Breath sounds: No stridor. Examination of the right-lower field reveals rales. Rales present. No decreased breath sounds, wheezing or rhonchi.   Chest:       Chest wall: Tenderness present. No mass, deformity, crepitus or edema. There is no dullness to percussion.      Comments: Reproducible right chest pain to palpation.   Abdominal:      Palpations: Abdomen is soft.      Tenderness: There is no abdominal tenderness.   Musculoskeletal: Normal range of motion.         General: Tenderness present.      Right lower leg: No edema.      Left lower leg: No edema.      Comments: Reproducible tenderness to palpation of the right trapezius area.  Patient will have worsening pain with elevating her right shoulder.  Good symmetric pulses bilateral upper extremities.   Skin:     Capillary Refill: Capillary refill takes less than 2 seconds.   Neurological:      General: No focal deficit present.      Mental Status: She is alert and oriented to person, place, and time.   Psychiatric:         Mood and Affect: Mood is anxious.             MDM:   MDM  Number of Diagnoses or Management Options  Bilateral pulmonary embolism:   Chest pain, unspecified type:   Diagnosis management comments: 11:18 AM: Spoke to Dr. Lazaro Arms.  Recommended admission.  He kindly accepted the patient.  He recommended Lovenox shot.  Patient has bilateral pulmonary embolism, no sign of heart strain.  12:08 PM: spoke to Dr. Simmie Davies , he does not think that she any any contraindication to anticoagulations for bilateral PEs.  Lovenox ordered.  She is agreeable to be admitted.       Amount and/or Complexity of Data Reviewed  Clinical lab tests: ordered and reviewed  Discuss the patient with other providers: yes    Patient Progress  Patient progress: stable    12:10 PM      Course in ED:     12:10 PM      Diagnostic Results:   The results of the diagnostic studies have been reviewed by myself:    Radiologic Studies  Ct Angiogram Chest  Result Date: 12/05/2019  1. There are bilateral pulmonary emboli as described above without CT evidence of pulmonary arterial hypertension. 2. There is patchy groundglass  opacification in the right lower lobe and a small right pleural effusion. This is nonspecific but could represent pulmonary ischemia/infarct in the setting of pulmonary emboli. Atypical infection or asymmetric edema could have a similar appearance. RECOMMENDATION: If clinical submassive or massive PE, consider consulting interventional radiology to evaluate if the patient is a candidate for catheter directed thrombolysis. CRITICAL RESULT: Critical result of pulmonary embolism was communicated with Dr. Mittie Bodo by myself on December 05, 2019 at 11:10 AM.  Understanding of critical result was confirmed by a read back. ReadingStation:[email or web address]    Xr Chest Ap Portable    Result Date: 12/05/2019  There is right basilar airspace disease suspected to represent developing infection. Follow-up to resolution is recommended. ReadingStation:[email or web address]      Lab Studies  Results     Procedure Component Value Units Date/Time    Urinalysis with Microscopic if Indicated [[id number]]  (Abnormal) Collected: 12/05/19 1039    Specimen: Urine, Random Updated: 12/05/19 1114     Color, UA Light-Yellow     Clarity, UA Clear     Urine Specific Gravity 1.017     pH, Urine 5.5 pH      Protein, UR Negative mg/dL      Glucose, UA Negative mg/dL      Ketones UA Negative mg/dL      Bilirubin, UA Negative     Blood, UA Negative     Nitrite, UA Negative     Urobilinogen, UA Normal mg/dL      Leukocyte Esterase, UA 75 Leu/uL      UR Micro Performed     WBC, UA 6 /hpf      RBC, UA 3 /hpf      Squam Epithel, UA 52 /lpf     B-type Natriuretic Peptide [[id number]] Collected: 12/05/19 0844    Specimen: Blood Updated: 12/05/19 0958     B-Natriuretic Peptide 36.7 pg/mL     APTT [[id number]] Collected: 12/05/19 0844    Specimen: Blood Updated: 12/05/19 0942     aPTT 24.0 sec     Prothrombin time/INR [[id number]] Collected: 12/05/19 0844    Specimen: Blood Updated: 12/05/19 0941     PT 10.5 sec      PT INR 1.0    CBC and differential [[id number]]  (Abnormal) Collected:  12/05/19 0844    Specimen: Blood Updated: 12/05/19 0939     WBC 6.4 K/cmm      RBC 3.23 M/cmm      Hemoglobin 9.9 gm/dL      Hematocrit 24.4 %      MCV 103 fL      MCH 31 pg      MCHC 30 gm/dL      RDW 01.0 %      PLT CT 188 K/cmm      MPV 10.5 fL      Neutrophils % 58.4 %      Lymphocytes 23.0 %      Monocytes 11.4 %      Eosinophils % 5.4 %      Basophils % 1.8 %      Neutrophils Absolute 3.8 K/cmm      Lymphocytes Absolute 1.5 K/cmm      Monocytes Absolute 0.7 K/cmm      Eosinophils Absolute 0.3 K/cmm      Basophils Absolute 0.1 K/cmm  RBC Morphology Morphology Consistent with Hemogram     Anisocytosis 1+    Troponin I [[id number]] Collected: 12/05/19 0844    Specimen: Plasma Updated: 12/05/19 0925     Troponin I 0.00 ng/mL     Comprehensive metabolic panel [[id number]]  (Abnormal) Collected: 12/05/19 0844    Specimen: Plasma Updated: 12/05/19 0919     Sodium 140 mMol/L      Potassium 3.4 mMol/L      Chloride 104 mMol/L      CO2 27.0 mMol/L      Calcium 8.7 mg/dL      Glucose 782 mg/dL      Creatinine 9.56 mg/dL      BUN 14 mg/dL      Protein, Total 6.1 gm/dL      Albumin 3.0 gm/dL      Alkaline Phosphatase 74 U/L      ALT <6 U/L      AST (SGOT) 10 U/L      Bilirubin, Total 0.3 mg/dL      Albumin/Globulin Ratio 0.97 Ratio      Anion Gap 12.4 mMol/L      BUN / Creatinine Ratio 12.5 Ratio      EGFR 48 mL/min/1.61m2      Osmolality Calculated 281 mOsm/kg      Globulin 3.1 gm/dL     D-dimer, quantitative [[id number]]  (Abnormal) Collected: 12/05/19 0844    Specimen: Blood Updated: 12/05/19 0905     D-Dimer 2.54 mg/L FEU         EKG: Sinus rhythm, no STEMI          Procedure / Critical Care time/EKG:   Procedures    Total time providing critical care:     ATTESTATIONS   This chart was generated by an EMR and may contain errors or additions/omissions not intended by the user.         Blima Dessert, MD     Blima Dessert, MD  12/06/19 [phone]

## 2019-12-06 DIAGNOSIS — I82409 Acute embolism and thrombosis of unspecified deep veins of unspecified lower extremity: Secondary | ICD-10-CM | POA: Diagnosis present

## 2019-12-06 DIAGNOSIS — I82431 Acute embolism and thrombosis of right popliteal vein: Secondary | ICD-10-CM | POA: Diagnosis present

## 2019-12-06 LAB — CBC AND DIFFERENTIAL
Basophils %: 1 % (ref 0.0–3.0)
Basophils Absolute: 0.1 10*3/uL (ref 0.0–0.3)
Eosinophils %: 5 % (ref 0.0–7.0)
Eosinophils Absolute: 0.4 10*3/uL (ref 0.0–0.8)
Hematocrit: 29.9 % — ABNORMAL LOW (ref 36.0–48.0)
Hemoglobin: 8.7 gm/dL — ABNORMAL LOW (ref 12.0–16.0)
Lymphocytes Absolute: 2.6 10*3/uL (ref 0.6–5.1)
Lymphocytes: 30 % (ref 15.0–46.0)
MCH: 30 pg (ref 28–35)
MCHC: 29 gm/dL — ABNORMAL LOW (ref 32–36)
MCV: 102 fL — ABNORMAL HIGH (ref 80–100)
MPV: 11.3 fL — ABNORMAL HIGH (ref 6.0–10.0)
Monocytes Absolute: 0.7 10*3/uL (ref 0.1–1.7)
Monocytes: 8 % (ref 3.0–15.0)
Myelocytes: 4 % — ABNORMAL HIGH (ref 0–0)
Neutrophils %: 52 % (ref 42.0–78.0)
Neutrophils Absolute: 4.8 10*3/uL (ref 1.7–8.6)
PLT CT: 166 10*3/uL (ref 130–440)
RBC: 2.91 10*6/uL — ABNORMAL LOW (ref 3.80–5.00)
RDW: 20.1 % — ABNORMAL HIGH (ref 11.0–14.0)
WBC: 8.5 10*3/uL (ref 4.0–11.0)

## 2019-12-06 LAB — TYPE AND SCREEN
AB Screen: NEGATIVE
ABO Rh: O POS

## 2019-12-06 LAB — VH STOOL OCCULT BLOOD: Occult Blood: NEGATIVE

## 2019-12-06 LAB — COMPREHENSIVE METABOLIC PANEL
ALT: 9 U/L (ref 0–55)
AST (SGOT): 15 U/L (ref 10–42)
Albumin/Globulin Ratio: 0.93 Ratio (ref 0.80–2.00)
Albumin: 2.5 gm/dL — ABNORMAL LOW (ref 3.5–5.0)
Alkaline Phosphatase: 67 U/L (ref 40–145)
Anion Gap: 12.8 mMol/L (ref 7.0–18.0)
BUN / Creatinine Ratio: 12.9 Ratio (ref 10.0–30.0)
BUN: 12 mg/dL (ref 7–22)
Bilirubin, Total: 0.2 mg/dL (ref 0.1–1.2)
CO2: 25 mMol/L (ref 20.0–30.0)
Calcium: 8.1 mg/dL — ABNORMAL LOW (ref 8.5–10.5)
Chloride: 105 mMol/L (ref 98–110)
Creatinine: 0.93 mg/dL (ref 0.60–1.20)
EGFR: 60 mL/min/{1.73_m2} (ref 60–150)
Globulin: 2.7 gm/dL (ref 2.0–4.0)
Glucose: 130 mg/dL — ABNORMAL HIGH (ref 71–99)
Osmolality Calculated: 279 mOsm/kg (ref 275–300)
Potassium: 3.8 mMol/L (ref 3.5–5.3)
Protein, Total: 5.2 gm/dL — ABNORMAL LOW (ref 6.0–8.3)
Sodium: 139 mMol/L (ref 136–147)

## 2019-12-06 LAB — MAGNESIUM: Magnesium: 1.5 mg/dL — ABNORMAL LOW (ref 1.6–2.6)

## 2019-12-06 LAB — HEMOGLOBIN AND HEMATOCRIT, BLOOD
Hematocrit: 30.5 % — ABNORMAL LOW (ref 36.0–48.0)
Hemoglobin: 9.2 gm/dL — ABNORMAL LOW (ref 12.0–16.0)

## 2019-12-06 MED ORDER — REVLIMID 10 MG PO CAPS
10.00 mg | ORAL_CAPSULE | Freq: Every day | ORAL | Status: AC
Start: 2019-12-06 — End: ?

## 2019-12-06 MED ORDER — OMEPRAZOLE 20 MG PO CPDR
20.00 mg | DELAYED_RELEASE_CAPSULE | Freq: Two times a day (BID) | ORAL | 0 refills | Status: AC
Start: 2019-12-06 — End: ?

## 2019-12-06 MED ORDER — ACETAMINOPHEN-CODEINE #3 300-30 MG PO TABS
1.00 | ORAL_TABLET | Freq: Three times a day (TID) | ORAL | 0 refills | Status: AC | PRN
Start: 2019-12-06 — End: 2019-12-13

## 2019-12-06 MED ORDER — APIXABAN 5 MG PO TABS
ORAL_TABLET | ORAL | 0 refills | Status: AC
Start: 2019-12-06 — End: ?

## 2019-12-06 MED ORDER — MAGNESIUM SULFATE IN D5W 1-5 GM/100ML-% IV SOLN
1.00 g | Freq: Once | INTRAVENOUS | Status: AC
Start: 2019-12-06 — End: 2019-12-06
  Administered 2019-12-06: 10:00:00 1 g via INTRAVENOUS
  Filled 2019-12-06: qty 100

## 2019-12-06 MED ORDER — CODEINE SULFATE 30 MG PO TABS
30.0000 mg | ORAL_TABLET | Freq: Four times a day (QID) | ORAL | Status: DC | PRN
Start: 2019-12-06 — End: 2019-12-06

## 2019-12-06 NOTE — Progress Notes (Signed)
Patient O2 is 98% on room air, no complaint voiced. No obvious distress noted.    Shaneen Reeser RN

## 2019-12-06 NOTE — Discharge Summary (Signed)
MEDICINE DISCHARGE SUMMARY     Patient: Jillian Durham  Admission Date: 12/05/2019   DOB: Jun 09, 1946  Discharge Date: 12/06/2019    MRN: [id number]  Discharge Attending: Mariah Milling, FNP   Referring Physician: Marisa Sprinkles, MD  PCP: Pcp, None, MD       Discharge Information   Discharge Diagnosis:   Principal Problem:    Acute pulmonary embolism, bilateral  Active Problems:    Chronic gout of right foot    Stage 3a chronic kidney disease, baseline Cr 1.1-1.2    MDS (myelodysplastic syndrome)    GERD (gastroesophageal reflux disease)    Chronic anemia, baseline Hg 8-9    Pulmonary embolus and infarction, RLL    Pulmonary embolism with infarction    Hypomagnesemia    Acute deep vein thrombosis (DVT) of popliteal vein of right lower extremity    DVT (deep venous thrombosis)  Resolved Problems:    * No resolved hospital problems. [facility] Course  BRICEYDA ABDULLAH is a 74 y.o. female with PMH significant for MDS on revlimid who rode in car 6 hours on 12/02/2019 then developed right side chest pain on 12/03/2019.  She presented to the hospital 12/05/2019 with elevated D-dimer, CTA chest notable for bilateral PE with RLL infarction, ultrasound notable for small residual clot in the right popliteal and proximal femoral vein.  She does have a history of anemia and GI blood loss.  Hemoccult negative during this admission and hemoglobin stable~8-9.  Treated with therapeutic dose Lovenox during hospitalization, will initiate Eliquis at discharge with close follow up by oncology.          Discharge Medication List      Taking    allopurinol 100 MG tablet  Dose: 100 mg  Commonly known as: ZYLOPRIM  Take 100 mg by mouth daily     apixaban 5 MG  Commonly known as: ELIQUIS  2 tabs every 12 hours for 7 days then 1 tab every 12 hours     ferrous sulfate 300 (60 Fe) MG/5ML syrup  Dose: 65 mg  Take 65 mg by mouth daily     furosemide 20 MG tablet  Dose: 20 mg  Commonly known as: LASIX  Take 20 mg by mouth 2 (two) times  daily     loratadine 10 MG tablet  Dose: 10 mg  Commonly known as: CLARITIN  Take 10 mg by mouth daily     melatonin 3 mg tablet  Dose: 3 mg  Take 3 mg by mouth     Multivitamin/Fluoride 0.25 MG Chew  Chew by mouth     omeprazole 20 MG capsule  Dose: 20 mg  What changed: when to take this  Commonly known as: PriLOSEC  Take 1 capsule (20 mg total) by mouth 2 (two) times daily     Revlimid 10 MG Caps  Dose: 10 mg  What changed: additional instructions  For: 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 Takes for 21 days, off for 7     vitamin D (cholecalciferol) 10 MCG (400 UNIT) tablet  Dose: 25 mcg  Take 25 mcg by mouth daily            Progress Note/Physical Exam at Discharge     Vitals:    12/06/19 0321 12/06/19 0415 12/06/19 5409  12/06/19 1222   BP: 102/54 112/55 119/56 113/57   Pulse: 90 87 86 87   Resp:  16 18 18    Temp: 97.5 F (36.4 C) 97.2 F (36.2 C) 97.9 F (36.6 C) 97.2 F (36.2 C)   TempSrc: Temporal Temporal Temporal Temporal   SpO2: 95% 95% 94% 98%   Weight:       Height:         Physical Exam  Vitals signs and nursing note reviewed.   Constitutional:       General: She is not in acute distress.     Appearance: Normal appearance.   HENT:      Head: Normocephalic and atraumatic.      Nose: Nose normal.      Mouth/Throat:      Mouth: Mucous membranes are moist.      Pharynx: Oropharynx is clear.   Eyes:      Conjunctiva/sclera: Conjunctivae normal.   Neck:      Musculoskeletal: Neck supple.   Cardiovascular:      Rate and Rhythm: Normal rate and regular rhythm.      Heart sounds: Normal heart sounds. No murmur. No friction rub. No gallop.    Pulmonary:      Effort: Pulmonary effort is normal. No respiratory distress.      Breath sounds: Normal breath sounds. No stridor. No wheezing, rhonchi or rales.   Abdominal:      General: Bowel sounds are normal. There is  no distension.      Palpations: Abdomen is soft.      Tenderness: There is no abdominal tenderness. There is no guarding.   Musculoskeletal: Normal range of motion.         General: No swelling, tenderness or deformity.   Skin:     General: Skin is warm and dry.      Coloration: Skin is not jaundiced.      Findings: No bruising.   Neurological:      General: No focal deficit present.      Mental Status: She is alert and oriented to person, place, and time.   Psychiatric:         Mood and Affect: Mood normal.         Behavior: Behavior normal.          Diagnostics     Recent Labs   Lab 12/06/19  1247 12/06/19  0413 12/05/19  2034 12/05/19  0844   WBC  --  8.5  --  6.4   Hemoglobin 9.2* 8.7* 9.5* 9.9*   Hematocrit 30.5* 29.9* 31.3* 33.1*   PLT CT  --  166  --  188     Recent Labs   Lab 12/06/19  0413 12/05/19  0844   Sodium 139 140   Potassium 3.8 3.4*   Chloride 105 104   CO2 25.0 27.0   BUN 12 14   Creatinine 0.93 1.12   Glucose 130* 113*   Calcium 8.1* 8.7   Magnesium 1.5*  --      Recent Labs   Lab 12/05/19  0844   Troponin I 0.00     Procedures/Imaging:   Ct Angiogram Chest    Result Date: 12/05/2019  1. There are bilateral pulmonary emboli as described above without CT evidence of pulmonary arterial hypertension. 2. There is patchy groundglass opacification in the right lower lobe and a small right pleural effusion. This is nonspecific but could represent pulmonary ischemia/infarct in the setting of  pulmonary emboli. Atypical infection or asymmetric edema could have a similar appearance. RECOMMENDATION: If clinical submassive or massive PE, consider consulting interventional radiology to evaluate if the patient is a candidate for catheter directed thrombolysis. CRITICAL RESULT: Critical result of pulmonary embolism was communicated with Dr. Mittie Bodo by myself on December 05, 2019 at 11:10 AM.  Understanding of critical result was confirmed by a read back. ReadingStation:[email or web address]    Xr Chest Ap Portable    Result Date:  12/05/2019  There is right basilar airspace disease suspected to represent developing infection. Follow-up to resolution is recommended. ReadingStation:[email or web address]    US Venous Low Extrem Duplx Dopp Comp Bilat    Result Date: 12/05/2019  Patient with known pulmonary emboli. Presence of a small residual clot in the right popliteal vein and in the proximal femoral vein. ReadingStation:[email or web address]    Treatment Team:   Attending Provider: Shelva Majestic, MD     Patient Instructions   Discharge Diet: regular diet  Discharge Condition: stable  Discharge Code Status: NO CPR - SUPPORT OK  Discharge Disposition:  Home   Follow Up Appointment:  Follow-up Information     Oncologist In 1 week.                  Time spent examining patient, discussing with patient/family regarding hospital course, chart review, reconciling medications and discharge planning: 35 minutes.    Signed,  Mariah Milling, FNP  1:48 PM 12/06/2019

## 2019-12-06 NOTE — Discharge Instr - Diet (Signed)
Resume diet as tolerated

## 2019-12-06 NOTE — Progress Notes (Signed)
MEDICINE PROGRESS NOTE      Patient: Jillian Durham  Date: 12/06/2019   LOS: 1 Days  Admission Date: 12/05/2019   MRN: [id number]  Attending: Mariah Milling, FNP     INTERIM SUMMARY     UNDRA TREMBATH is a 74 y.o. female with PMH significant for MDS on revlimid who rode in car 6 hours on 12/02/2019 then developed right side chest pain on 12/03/2019.  She presented to the hospital 12/05/2019 with elevated D-dimer, CTA chest notable for bilateral PE with RLL infarction, ultrasound notable for small residual clot in the right popliteal and proximal femoral vein.    SUBJECTIVE     Hospital follow up for pulmonary embolus with RLL infarction    MEDICATIONS     Current Facility-Administered Medications   Medication Dose Route Frequency    allopurinol  100 mg Oral Daily    [START ON 12/07/2019] bisacodyl  5 mg Oral Daily    [START ON 12/07/2019] bisacodyl  10 mg Rectal Daily    enoxaparin  1 mg/kg Subcutaneous Q12H    ferrous sulfate  325 mg Oral QAM W/BREAKFAST    magnesium sulfate  1 g Intravenous Once    pantoprazole  40 mg Oral BID AC    polyethylene glycol  17 g Oral BID    sodium chloride (PF)  3 mL Intravenous Q8H       ROS   Negative except nausea  PHYSICAL EXAM   Physical Exam  Vitals signs and nursing note reviewed.   Constitutional:       General: She is not in acute distress.     Appearance: Normal appearance.   HENT:      Head: Normocephalic and atraumatic.      Nose: Nose normal.      Mouth/Throat:      Mouth: Mucous membranes are moist.      Pharynx: Oropharynx is clear.   Eyes:      Conjunctiva/sclera: Conjunctivae normal.   Neck:      Musculoskeletal: Neck supple.   Cardiovascular:      Rate and Rhythm: Normal rate and regular rhythm.      Heart sounds: Normal heart sounds. No murmur. No friction rub. No gallop.    Pulmonary:      Effort: Pulmonary effort is normal. No respiratory distress.      Breath sounds: Normal breath sounds. No stridor. No wheezing, rhonchi or rales.   Abdominal:      General:  Bowel sounds are normal. There is no distension.      Palpations: Abdomen is soft.      Tenderness: There is no abdominal tenderness. There is no guarding.   Musculoskeletal: Normal range of motion.         General: No swelling, tenderness or deformity.      Right lower leg: No edema.      Left lower leg: No edema.   Skin:     General: Skin is warm and dry.      Coloration: Skin is not jaundiced.      Findings: No bruising.   Neurological:      General: No focal deficit present.      Mental Status: She is alert and oriented to person, place, and time.   Psychiatric:         Mood and Affect: Mood normal.         Behavior: Behavior normal.        I/O last 3  completed shifts:  In: 100 [IV Piggyback:100]  Out: -      Vitals:    12/06/19 0319 12/06/19 0321 12/06/19 0415 12/06/19 0807   BP:  102/54 112/55 119/56   Pulse:  90 87 86   Resp:   16 18   Temp:  97.5 F (36.4 C) 97.2 F (36.2 C) 97.9 F (36.6 C)   TempSrc:  Temporal Temporal Temporal   SpO2: (!) 86% 95% 95% 94%   Weight:       Height:         LABS     Recent Labs   Lab 12/06/19  0413 12/05/19  2034 12/05/19  0844   WBC 8.5  --  6.4   RBC 2.91*  --  3.23*   Hemoglobin 8.7* 9.5* 9.9*   Hematocrit 29.9* 31.3* 33.1*   MCV 102*  --  103*   PLT CT 166  --  188     Recent Labs   Lab 12/06/19  0413 12/05/19  0844   Sodium 139 140   Potassium 3.8 3.4*   Chloride 105 104   CO2 25.0 27.0   BUN 12 14   Creatinine 0.93 1.12   Glucose 130* 113*   Calcium 8.1* 8.7   Magnesium 1.5*  --      Recent Labs   Lab 12/06/19  0413 12/05/19  0844   ALT 9 <6   AST (SGOT) 15 10   Bilirubin, Total 0.2 0.3   Albumin 2.5* 3.0*   Alkaline Phosphatase 67 74     Recent Labs   Lab 12/05/19  0844   Troponin I 0.00     Lab Results   Component Value Date    BNP 36.7 12/05/2019      Recent Labs   Lab 12/05/19  0844   PT INR 1.0   PT 10.5        No results for input(s): MICRO in the last 24 hours.  RADIOLOGY   Radiological Procedure reviewed.  Ct Angiogram Chest    Result Date: 12/05/2019  1. There  are bilateral pulmonary emboli as described above without CT evidence of pulmonary arterial hypertension. 2. There is patchy groundglass opacification in the right lower lobe and a small right pleural effusion. This is nonspecific but could represent pulmonary ischemia/infarct in the setting of pulmonary emboli. Atypical infection or asymmetric edema could have a similar appearance. RECOMMENDATION: If clinical submassive or massive PE, consider consulting interventional radiology to evaluate if the patient is a candidate for catheter directed thrombolysis. CRITICAL RESULT: Critical result of pulmonary embolism was communicated with Dr. Mittie Bodo by myself on December 05, 2019 at 11:10 AM.  Understanding of critical result was confirmed by a read back. ReadingStation:[email or web address]    Xr Chest Ap Portable    Result Date: 12/05/2019  There is right basilar airspace disease suspected to represent developing infection. Follow-up to resolution is recommended. ReadingStation:[email or web address]    US Venous Low Extrem Duplx Dopp Comp Bilat    Result Date: 12/05/2019  Patient with known pulmonary emboli. Presence of a small residual clot in the right popliteal vein and in the proximal femoral vein. ReadingStation:[email or web address]    ACTIVE PROBLEM LIST     Active Hospital Problems    Diagnosis    Hypomagnesemia    Acute deep vein thrombosis (DVT) of popliteal vein of right lower extremity    Chronic anemia, baseline Hg 8-9    Acute pulmonary embolism, bilateral    Pulmonary embolus and  infarction, RLL    Pulmonary embolism with infarction    Chronic gout of right foot    GERD (gastroesophageal reflux disease)    Stage 3a chronic kidney disease, baseline Cr 1.1-1.2    MDS (myelodysplastic syndrome)     ASSESSMENT AND PLAN   Acute pulmonary embolism, bilateral  Pulmonary embolus and infarction, RLL  DVT of right popliteal vein  -likely precipitated by revlimid and 6 hour car ride on 12/02/19  -no RH strain on CT  -check US DVT  -Echocardiogram  12/05/2019, EF 60-65%, grade I diastolic dysfunction, RV normal size and systolic function  -continue lovenox 1mg /kg BID  -consider DOAC later today pending stool hemoccult and repeat H&H  -Stopping Percocet, change to Tylenol for pain    Chronic Anemia, baseline Hgb 8-9  -Admit Hgb 9.9  -Today Hgb 8.7  -Repeat H&H  -continue iron supplement  -Hemoccult stool  -Continue PPI BID     Stage 3a chronic kidney disease, baseline Cr 1.1-1.2  -admit Cr 1.12  -Today Cr 0.93  -Repeat chemistry in AM    MDS (myelodysplastic syndrome)  -follow with oncologist in [place], recently on revlimid    GERD (gastroesophageal reflux disease)  -PPI BID    Chronic gout of right foot  -allopurinol     History of COVID 19 infection  -January, recovered     DVT/VTE Prophylaxis  moderate (or high) risk - SCDs and lovenox    Mariah Milling, FNP  12/06/2019 9:30 AM

## 2019-12-06 NOTE — Progress Notes (Signed)
[facility]  [address] [place], [place] [address]      Discharge Note         12/06/19 1516   Discharge Disposition   Physical Discharge Disposition Home   Mode of Transportation Car   Patient/Family/POA notified of transfer plan Yes   Patient agreeable to discharge plan/expected d/c date? Yes   Family/POA agreeable to discharge plan/expected d/c date? Yes   Bedside nurse notified of transport plan? Yes   CM Interventions   Follow up appointment scheduled? No   Reason no follow up scheduled? Family to schedule   Referral made for home health RN visit? Does not meet home bound criteria   Multidisciplinary rounds/family meeting before d/c? Yes   Medicare Checklist   Is this a Medicare patient? Yes   Patient received 1st IMM Letter? Yes   3 midnight inpatient qualifying stay (SNF only) No   If LOS 3 days or greater, did patient received 2nd IMM Letter? n/a         Clois Comber. Yarethzi Branan, BSW  Social Worker  [facility]  [phone]

## 2019-12-06 NOTE — Discharge Instr - Activity (Signed)
Resume activity as tolerated.

## 2019-12-06 NOTE — Progress Notes (Signed)
Nutrition Therapy  Nutrition Screen    Patient Information:     Name:Jillian Durham   Age: 74 y.o.   Sex: female     MRN: [id number]    Reason For Screen:     MST >/= 2 (weight loss and/or poor oral intake)    Nutrition Assessment:     Pt screen for MST=4 ; patient's daughter also present during dietetic intern visit.     Pt was admitted to the hospital on 12/05/2019.     Active Hospital Problems    Diagnosis   . Hypomagnesemia   . Acute deep vein thrombosis (DVT) of popliteal vein of right lower extremity   . DVT (deep venous thrombosis)   . Chronic anemia, baseline Hg 8-9   . Acute pulmonary embolism, bilateral   . Pulmonary embolus and infarction, RLL   . Pulmonary embolism with infarction   . Chronic gout of right foot   . GERD (gastroesophageal reflux disease)   . Stage 3a chronic kidney disease, baseline Cr 1.1-1.2   . MDS (myelodysplastic syndrome)     At visit with pt, she states her weight loss has been gradual over a year.  Reports her oncology doctor is following her weight status.   She likes ice cream (chocolate).  Her meals are prepared by family. She likes breakfast to include waffles and bacon and  lunch ramon noodles. Her breakfast intake was 25% per I/O. Patient and her daughter reports that her appetite varies depending on who is around and what the food is.   She states no issues chewing or swallowing.   No report of nutritional supplement use at home. Would benefit from trying Thrive ice cream if hospitalization continued, but patient to discharge today per progress notes.     CBW: 59kg (130 lb)  BMI: 23.03 kg/m    Recent Labs   Lab 12/06/19  0413   Sodium 139   Potassium 3.8   Chloride 105   CO2 25.0   BUN 12   Creatinine 0.93   EGFR 60   Glucose 130*   Calcium 8.1*     Nutrition Risk Level: Moderate ; poor PO intakes currently; patient discharging today.     RD to follow per policy, nutritional status change or MD Consult    Eda Keys, Dietetic Intern  12/06/2019 2:15 PM     This RD has  reviewed and agrees with above assessment and interventions - [place] Baudelia Schroepfer, MS RD

## 2019-12-06 NOTE — Progress Notes (Signed)
Readmission Risk  [facility] MED SURG   Patient Name: Jillian [place]   Attending Physician: Shelva Majestic, MD   Today's date:   12/06/2019 LOS: 1 days   Expected Discharge Date      Readmission Assessment:                                                              Discharge Planning  ReAdmit Risk Score: 15  Care Coordination with Palliative Care: Not Available  Does the patient have perscription coverage?: Yes  Utilize [place] [facility]: No  Confirmed PCP with Pt: Yes  Confirmed PCP name: L Nascimento  Confirm Transport to F/U Appt.: Self/Private Vehicle/Friend  Social Work Referral: Not Applicable  Anticipated Home Health at [place]: No  Anticipated Placement at [place]: No  CM Comments: 3/9 - SW - Bilateral PE - Eliquis at discharge does not require prior auth and patient can afford cost at this time- Staying with daughter in [place] for several weeks - Will set telemedicine follow up with MD in [place]    Met with patient and daughter [place] - Patient is traveling from [place] - Patient independent at baseline for all ADL's and IADL's - Patient will need Eliquis at discharge - SW spoke with insurance company - No prior auth required -  Cost for first Rx is $142 due to deductible - Then $47 after that - Family expressed concerns over cost due to patient's limited income - Family decided to start the Eliquis and plans to confer with patient's oncologist and PCP in [place] in the future regarding better cost of medications -     SW discussed need for follow up after hospital stay - Daughter reported that she will contact patient's MD in [place] to arrange a telehealth visit     IDPA:   Patient Type  Within 30 Days of Previous Admission?: No  Healthcare Decisions  Interviewed:: Patient, Family  Orientation/Decision Making Abilities of Patient: Alert and Oriented x3, able to make decisions  Advance Directive: Patient does not have advance directive  Healthcare Agent  Appointed: No  Prior to admission  Prior level of function: Independent with ADLs, Ambulates independently  Type of Residence: Private residence  Home Layout: One level, Ramped entrance  Have running water, electricity, heat, etc?: Yes  Living Arrangements: Alone  How do you get to your MD appointments?: Self  How do you get your groceries?: Self  Who fixes your meals?: Self  Who does your laundry?: Self  Who picks up your prescriptions?: Self  Dressing: Independent  Grooming: Independent  Feeding: Independent  Bathing: Independent  Toileting: Independent  DME Currently at Home: Gilmer Mor, [employer]  Adult [employer] (APS) involved?: No  Discharge Planning  Support Systems: Children  Patient expects to be discharged to:: Home - Daughter's home  Anticipated [place] plan discussed with:: Same as interviewed  Mode of transportation:: Private car (family member)  Does the patient have perscription coverage?: Yes  Consults/Providers  PT Evaluation Needed: No  OT Evalulation Needed: No  SLP Evaluation Needed: No  Family and PCP  PCP on file was verified as the current PCP?: Provider not on file  PCP - first name: Alesia Banda  PCP - last name: Chase Caller  PCP - city: [place]  PCP - state: [place]  PCP - phone : [phone]   30 Day Readmission:       Provider Notifications:            Clois Comber. Aishwarya Shiplett, BSW  Social Worker  [facility]  [phone]

## 2019-12-06 NOTE — UM Notes (Signed)
[facility] Utilization Management Review Sheet    Facility :  [facility]    NAMEMarland Kitchen Jillian Durham  MR#: [id number]    CSN#: [id number]    ROOM: 215/215-A AGE: 74 y.o.    ADMIT DATE AND TIME: 12/05/2019  8:26 AM    PATIENT CLASS: Inpatient    ATTENDING PHYSICIAN: Shelva Majestic, MD      PAYOR:Payor: MEDICARE [employer] / Plan: [employer]HC A[employer] MED ADV [phone] / Product Type: MANAGED MEDICARE /     AUTH #: Pending    DIAGNOSIS:     ICD-10-CM    1. Bilateral pulmonary embolism  I26.99    2. Chest pain, unspecified type  R07.9        HISTORY:   Past Medical History:   Diagnosis Date    Calculus of kidney     Gout        DATE OF REVIEW: 12/06/2019     History of present illness:  "Patient admitted through emergency department who presented with right side chest pain since Saturday. She has MDS and takes revlimid, though currently in middle of week off.  She went on 6 hour car ride from [place] to here on Friday.  She said her legs were swollen that day.  The next day, Saturday, she had onset of right sided chest pain up to her right shoulder. This pain worse with movement, cough or deep breath.  Otherwise she gets SOB but only due to pain peaking from these provocations. She has chronic cough since she had covid in early [place] which she survived.  No fever, chills, headache, other SOB, other chest pain, nausea, vomiting, new diarrhea (has chronically), dysuria, new focal neuro changes" Per MD H&P    Vitals upon admission:  97.9-130/51-97-15-96%    Treatment in emergency department:  Patient received Tylenol IV x 1, Lovenox SQ x 1, KCl PO x 2, and Heparin bolus with infusion.    Physical exam:  Notable for patient appearing in acute distress. Patient noted to have right side chest wall tenderness per MD documentation.    Abnormal labs:  RBC 3.23  H&H 9.9/33.1  K 3.4  Albumin 3.0  D-dimer 2.54    Imaging:  CXR:  There is right basilar airspace disease suspected to represent developing infection. Follow-up to resolution is  recommended.  CTA chest:  1. There are bilateral pulmonary emboli as described above without CT evidence of pulmonary arterial hypertension.   2. There is patchy groundglass opacification in the right lower lobe and a small right pleural effusion. This is nonspecific but could represent pulmonary ischemia/infarct in the setting of pulmonary emboli. Atypical infection or asymmetric edema could    have a similar appearance.     Assessment/Plan:  Acute pulmonary embolism, bilateral  Pulmonary embolus and infarction, RLL  -likely precipitated by revlimid and 6 hour car ride on 3/5  -no RH strain on CT  -check US DVT  -check Echocardiogram  -lovenox 1mg /kg BID  -consider DOAC tomorrow  -pain control, bowel regimen  -not hypoxic currently  -telemetry  -monitor     Stage 3a chronic kidney disease, baseline Cr 1.1-1.2  -admit Cr 1.12  -avoid nephrotoxin, monitor     MDS (myelodysplastic syndrome)  Chronic anemia, baseline Hg 8-9  -follow with oncologist in [place], recently on revlimid  -admit Hg 9.9  -no bleeding currently, though history of melena with negative endoscopic workup  -recheck H/H tonight after initiation of anticoagulation, increase PPI to BID   -  counts otherwise acceptable     GERD (gastroesophageal reflux disease)  -PPI    Chronic gout of right foot  -allopurinol     History of COVID 19 infection  -January, recovered     GI Prophylaxis  continue PPI    Nutrition  Regular    DVT/VTE Prophylaxis  Lovenox 1 mg/kg subQ BID    Code Status: DNR, pressors okay, confirmed on admission with patient and daughter     This patient is being admitted under INPATIENT with Acute pulmonary embolism.    Inpatient appropriate per [facility] 24th edition inpatient criteria M-290.    [place] BSN RN  Utilization Review Nurse  [facility]  Phone: [phone]  Fax: [phone]      VITALS: BP 113/57    Pulse 87    Temp 97.2 F (36.2 C) (Temporal)    Resp 18    Ht 1.6 m (5\' 3" )    Wt 59 kg (130 lb)    SpO2 98%     BMI 23.03 kg/m

## 2019-12-06 NOTE — Plan of Care (Signed)
Problem: Compromised Hemodynamic Status  Goal: Vital signs and fluid balance maintained/improved  Outcome: Progressing  Flowsheets (Taken 12/06/2019 0433)  Vital signs and fluid balance are maintained/improved:   Position patient for maximum circulation/cardiac output   Monitor/assess vitals and hemodynamic parameters with position changes   Monitor intake and output. Notify LIP if urine output is less than 30 mL/hour.   Monitor/assess lab values and report abnormal values     Problem: Inadequate Gas Exchange  Goal: Adequate oxygenation and improved ventilation  Outcome: Progressing  Flowsheets (Taken 12/06/2019 0433)  Adequate oxygenation and improved ventilation:   Assess lung sounds   Monitor SpO2 and treat as needed   Provide mechanical and oxygen support to facilitate gas exchange   Position for maximum ventilatory efficiency   Plan activities to conserve energy: plan rest periods   Increase activity as tolerated/progressive mobility   Consult/collaborate with Respiratory Therapy  Goal: Patent Airway maintained  Outcome: Progressing  Flowsheets (Taken 12/06/2019 0433)  Patent airway maintained:   Position patient for maximum ventilatory efficiency   Provide adequate fluid intake to liquefy secretions   Suction secretions as needed     Problem: Inadequate Airway Clearance  Goal: Normal respiratory rate/effort achieved/maintained  Outcome: Progressing  Flowsheets (Taken 12/06/2019 0433)  Normal respiratory rate/effort achieved/maintained: Plan activities to conserve energy: plan rest periods     Problem: Inadequate Tissue Perfusion-Venous  Goal: Tissue perfusion is adequate-venous  Outcome: Progressing  Flowsheets (Taken 12/06/2019 0433)  Tissue perfusion is adequate-venous:   Increase activity as tolerated / progressive mobility   Elevate feet when in chair   Teach/review/reinforce ankle pump exercises   VTE  prevention: Administer anticoagulant(s) and/or apply anti-embolism stockings/devices as ordered     Problem:  Impaired Mobility  Goal: Mobility/Activity is maintained at optimal level for patient  Outcome: Progressing  Flowsheets (Taken 12/06/2019 0433)  Mobility/activity is maintained at optimal level for patient:   Increase mobility as tolerated/progressive mobility   Encourage independent activity per ability   Maintain proper body alignment   Perform active/passive ROM   Plan activities to conserve energy, plan rest periods   Assess for changes in respiratory status, level of consciousness and/or development of fatigue     Problem: Pain interferes with ability to perform ADL  Goal: Pain at adequate level as identified by patient  Outcome: Progressing  Flowsheets (Taken 12/06/2019 0433)  Pain at adequate level as identified by patient:   Identify patient comfort function goal   Assess for risk of opioid induced respiratory depression, including snoring/sleep apnea. Alert healthcare team of risk factors identified.   Assess pain on admission, during daily assessment and/or before any "as needed" intervention(s)   Reassess pain within 30-60 minutes of any procedure/intervention, per Pain Assessment, Intervention, Reassessment (AIR) Cycle   Evaluate if patient comfort function goal is met   Evaluate patient's satisfaction with pain management progress   Offer non-pharmacological pain management interventions   Include patient/patient care companion in decisions related to pain management as needed     Problem: Side Effects from Pain Analgesia  Goal: Patient will experience minimal side effects of analgesic therapy  Outcome: Progressing  Flowsheets (Taken 12/06/2019 0433)  Patient will experience minimal side effects of analgesic therapy:   Monitor/assess patient's respiratory status (RR depth, effort, breath sounds)   Assess for changes in cognitive function   Prevent/manage side effects per LIP orders (i.e. nausea, vomiting, pruritus, constipation, urinary retention, etc.)

## 2019-12-08 NOTE — UM Notes (Signed)
[facility]  Hennessey Cantrell  Auth # [id number]    Patient discharged 12/06/19.    Freda Munro BSN RN  Utilization Review Nurse  [facility]  Phone: [phone]  Fax: [phone]

## 2020-03-29 DEATH — deceased
